# Patient Record
Sex: Female | Born: 1985 | Race: White | Hispanic: No | Smoking: Current every day smoker
Health system: Southern US, Community
[De-identification: ages and names within clinical notes are randomized; demographics above are authoritative.]

## PROBLEM LIST (undated history)

## (undated) HISTORY — PX: BREAST SURGERY: SHX581

---

## 2009-02-16 ENCOUNTER — Emergency Department (HOSPITAL_COMMUNITY): Admission: EM | Admit: 2009-02-16 | Discharge: 2009-02-16 | Payer: Self-pay | Admitting: Emergency Medicine

## 2010-11-24 LAB — URINALYSIS, ROUTINE W REFLEX MICROSCOPIC
Bilirubin Urine: NEGATIVE
Nitrite: POSITIVE — AB
Protein, ur: >300 mg/dL — AB

## 2011-03-09 ENCOUNTER — Inpatient Hospital Stay (INDEPENDENT_AMBULATORY_CARE_PROVIDER_SITE_OTHER)
Admission: RE | Admit: 2011-03-09 | Discharge: 2011-03-09 | Disposition: A | Discharge status: Home / Self Care | Payer: Self-pay | Source: Ambulatory Visit | Attending: Family Medicine | Admitting: Family Medicine

## 2011-03-09 DIAGNOSIS — T5491XA Toxic effect of unspecified corrosive substance, accidental (unintentional), initial encounter: Secondary | ICD-10-CM

## 2011-03-09 DIAGNOSIS — T300 Burn of unspecified body region, unspecified degree: Secondary | ICD-10-CM

## 2015-10-08 ENCOUNTER — Encounter: Payer: Self-pay | Admitting: Emergency Medicine

## 2015-10-08 ENCOUNTER — Emergency Department (INDEPENDENT_AMBULATORY_CARE_PROVIDER_SITE_OTHER): Payer: Commercial Managed Care - PPO

## 2015-10-08 ENCOUNTER — Emergency Department (INDEPENDENT_AMBULATORY_CARE_PROVIDER_SITE_OTHER)
Admission: EM | Admit: 2015-10-08 | Discharge: 2015-10-08 | Disposition: A | Discharge status: Home / Self Care | Payer: Commercial Managed Care - PPO | Source: Home / Self Care | Attending: Family Medicine | Admitting: Family Medicine

## 2015-10-08 DIAGNOSIS — K760 Fatty (change of) liver, not elsewhere classified: Secondary | ICD-10-CM | POA: Diagnosis not present

## 2015-10-08 DIAGNOSIS — R1031 Right lower quadrant pain: Secondary | ICD-10-CM | POA: Diagnosis not present

## 2015-10-08 DIAGNOSIS — R112 Nausea with vomiting, unspecified: Secondary | ICD-10-CM

## 2015-10-08 LAB — POCT CBC W AUTO DIFF (K'VILLE URGENT CARE)

## 2015-10-08 LAB — POCT URINE PREGNANCY: Preg Test, Ur: NEGATIVE

## 2015-10-08 MED ORDER — ONDANSETRON HCL 4 MG PO TABS: 4.0000 mg | ORAL_TABLET | Freq: Four times a day (QID) | ORAL | Status: DC

## 2015-10-08 MED ORDER — HYDROCODONE-ACETAMINOPHEN 5-325 MG PO TABS: 2.0000 | ORAL_TABLET | Freq: Four times a day (QID) | ORAL | Status: DC | PRN

## 2015-10-08 MED ORDER — IOHEXOL 300 MG/ML  SOLN
100.0000 mL | Freq: Once | INTRAMUSCULAR | Status: AC | PRN
  Administered 2015-10-08: 100 mL via INTRAVENOUS

## 2015-10-08 NOTE — ED Notes (Signed)
LRQ pain started about 2:30 this morning, vomited x 1, went to [facility] they did UA, she has small amt of blood in urine

## 2015-10-08 NOTE — ED Provider Notes (Signed)
CSN: [id number]     Arrival date & time 10/08/15  1346 History   First MD Initiated Contact with Patient 10/08/15 1440     Chief Complaint  Patient presents with  . Abdominal Pain   (Consider location/radiation/quality/duration/timing/severity/associated sxs/prior Treatment) HPI  The pt is a 30yo female advised to come to [facility] by her PCP for further evaluation of RLQ abdominal pain that started this morning around 2:30AM.  She has associated nausea and vomited once since onset.  Pain is aching, cramping and sharp, 4/10 at this time. She did have ibuprofen earlier today.  She was also seen at [facility], they performed a UA and found a small amount of blood in her urine.  Pt denies hx of kidney stones. She is not on her menstrual cycle. Denies hx of ovarian cysts or fibroids.  Her PCP recommended CT and/or U/S.  Pt denies fever, chills, n/v/d. No hx of abdominal surgeries.   History reviewed. No pertinent past medical history. Past Surgical History  Procedure Laterality Date  . Breast surgery     No family history on file. Social History  Substance Use Topics  . Smoking status: Current Every Day Smoker -- 1.00 packs/day    Types: Cigarettes  . Smokeless tobacco: None  . Alcohol Use: Yes   OB History    No data available     Review of Systems  Constitutional: Negative.  Negative for fever and chills.  Respiratory: Negative for chest tightness and shortness of breath.   Cardiovascular: Negative for chest pain.  Gastrointestinal: Positive for nausea, vomiting and abdominal pain. Negative for diarrhea, constipation, blood in stool, abdominal distention, anal bleeding and rectal pain.  Genitourinary: Positive for flank pain (Right). Negative for dysuria, urgency, hematuria, vaginal bleeding, vaginal discharge, vaginal pain, menstrual problem and pelvic pain.  Musculoskeletal: Negative for myalgias and back pain.  Skin: Negative.   Neurological: Negative for weakness.    Allergies    Review of patient's allergies indicates no known allergies.  Home Medications   Prior to Admission medications   Medication Sig Start Date End Date Taking? Authorizing Provider  citalopram (CELEXA) 40 MG tablet Take 40 mg by mouth daily.   Yes Historical Provider, MD  levonorgestrel (MIRENA) 20 MCG/24HR IUD 1 each by Intrauterine route once.   Yes Historical Provider, MD  ondansetron (ZOFRAN-ODT) 4 MG disintegrating tablet Take 4 mg by mouth every 8 (eight) hours as needed for nausea or vomiting.   Yes Historical Provider, MD  HYDROcodone-acetaminophen (NORCO/VICODIN) 5-325 MG tablet Take 2 tablets by mouth every 6 (six) hours as needed for moderate pain or severe pain. 10/08/15   Junius Finner, PA-C  ondansetron (ZOFRAN) 4 MG tablet Take 1 tablet (4 mg total) by mouth every 6 (six) hours. 10/08/15   Junius Finner, PA-C   Meds Ordered and Administered this Visit  Medications - No data to display  BP 122/79 mmHg  Pulse 72  Temp(Src) 98.6 F (37 C) (Oral)  Ht  (1.626 m)  Wt 206 lb (93.441 kg)  BMI 35.34 kg/m2  SpO2 98%  LMP 09/19/2015 No data found.   Physical Exam  Constitutional: She appears well-developed and well-nourished. No distress.  Pt sitting on exam table, appears well, non-toxic. NAD.   HENT:  Head: Normocephalic and atraumatic.  Mouth/Throat: Oropharynx is clear and moist.  Eyes: Conjunctivae are normal. No scleral icterus.  Neck: Normal range of motion. Neck supple.  Cardiovascular: Normal rate, regular rhythm and normal heart sounds.  Pulmonary/Chest: Effort normal and breath sounds normal. No respiratory distress. She has no wheezes. She has no rales. She exhibits no tenderness.  Abdominal: Soft. Bowel sounds are normal. She exhibits no distension and no mass. There is tenderness. There is no rebound, no guarding and no CVA tenderness.    Soft, obese abdomen, tenderness in RLQ. No rebound, guarding or masses.  Musculoskeletal: Normal range of motion.   Neurological: She is alert.  Skin: Skin is warm and dry. She is not diaphoretic.  Nursing note and vitals reviewed.   ED Course  Procedures (including critical care time)  Labs Review Labs Reviewed  COMPLETE METABOLIC PANEL WITH GFR  POCT CBC W AUTO DIFF ([facility])  POCT URINE PREGNANCY    Imaging Review Ct Abdomen Pelvis W Contrast  10/08/2015  CLINICAL DATA:  30 year old with acute onset of right lower quadrant abdominal pain and tenderness which began earlier today. One episode of nausea and vomiting. No prior abdominal surgeries. Indwelling intrauterine device. EXAM: CT ABDOMEN AND PELVIS WITH CONTRAST TECHNIQUE: Multidetector CT imaging of the abdomen and pelvis was performed using the standard protocol following bolus administration of intravenous contrast. CONTRAST:  OMNIPAQUE IOHEXOL 300 MG/ML IV. Oral contrast was also administered. COMPARISON:  None. FINDINGS: Lower chest:  Heart size normal.  Visualized lung bases clear. Hepatobiliary: Diffuse hepatic steatosis without focal hepatic parenchymal abnormality. Anatomic variant in that the left lobe of the liver extends well across the midline into the left upper quadrant. Gallbladder normal in appearance without calcified gallstones. No biliary ductal dilation. Pancreas: Normal in appearance without evidence of mass, ductal dilation, or inflammation. Spleen: Normal in size and appearance. Adrenals/Urinary Tract: Normal appearing adrenal glands. Kidneys normal in size and appearance without focal parenchymal abnormality. No evidence of urinary tract calculi or obstruction. Normal-appearing urinary bladder. Stomach/Bowel: Stomach normal in appearance for the degree of distention. Normal-appearing small bowel. Normal-appearing colon with expected stool burden. Normal-appearing appendix without evidence of inflammation. No ascites. Vascular/Lymphatic: No visible aortoiliofemoral atherosclerosis. Widely patent visceral  arteries. Normal-appearing portal venous and systemic venous systems. No pathologic lymphadenopathy. Reproductive: Normal-appearing uterus. Intrauterine device appropriately positioned in the fundal endometrium. Normal ovaries for age containing multiple follicular cysts. No adnexal masses. Other: None. 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 No acute abnormality. IMPRESSION: 1. No acute abnormalities involving the abdomen or pelvis. Specifically, no evidence of acute appendicitis. 2. Diffuse hepatic steatosis without focal hepatic parenchymal abnormality. 3. Chronic musculoskeletal findings as detailed above. Electronically Signed   By: Hulan Saas M.D.   On: 10/08/2015 15:55      MDM   1. Non-intractable vomiting with nausea, vomiting of unspecified type   2. RLQ abdominal pain   3. Fatty liver    Pt c/o RLQ abdominal pain.  Pt directed to come to [facility] by her PCP as she needed further workup and did not want to wait in an Emergency Department.  Zofran was given at [facility]   Pt appears well, non-toxic. Is afebrile.  Tenderness in RLQ. suspicion for appendicitis vs ovarian cyst or fibroid vs kidney stone due to hx of hematuria.  Urine preg: negative CBC: unremarkable CMP: pending   CT abd: no acute abnormalities involving abdomen or pelvis.  Specifically, no evidence of appendicitis.  Diffuse hepatic steatosis w/o focal hepatic parenchymal abnormality   Pt reassured no evidence of emergent process taking place at  this time. No vomiting while pt in UC  Rx: Zofran and norco if pain becomes more severe  F/u with PCP this week for recheck of symptoms. Discussed symptoms that warrant emergent care in the ED. Patient verbalized understanding and agreement with treatment plan.    Junius Finner, PA-C 10/08/15 1929

## 2015-10-08 NOTE — Discharge Instructions (Signed)
°  Norco/Vicodin (hydrocodone-acetaminophen) is a narcotic pain medication, do not combine these medications with others containing tylenol. While taking, do not drink alcohol, drive, or perform any other activities that requires focus while taking these medications.  ° °

## 2015-10-09 ENCOUNTER — Telehealth: Payer: Self-pay | Admitting: Emergency Medicine

## 2015-10-09 LAB — COMPLETE METABOLIC PANEL WITH GFR
ALT: 23 U/L (ref 6–29)
AST: 15 U/L (ref 10–30)
Albumin: 4.4 g/dL (ref 3.6–5.1)
Alkaline Phosphatase: 78 U/L (ref 33–115)
BUN: 7 mg/dL (ref 7–25)
CO2: 27 mmol/L (ref 20–31)
Calcium: 9.3 mg/dL (ref 8.6–10.2)
Chloride: 107 mmol/L (ref 98–110)
Creat: 0.7 mg/dL (ref 0.50–1.10)
GFR, Est African American: >89 mL/min (ref >=60)
GFR, Est Non African American: >89 mL/min (ref >=60)
Glucose, Bld: 92 mg/dL (ref 65–99)
Potassium: 4 mmol/L (ref 3.5–5.3)
Sodium: 140 mmol/L (ref 135–146)
Total Bilirubin: 0.4 mg/dL (ref 0.2–1.2)
Total Protein: 7 g/dL (ref 6.1–8.1)

## 2015-12-25 ENCOUNTER — Emergency Department (INDEPENDENT_AMBULATORY_CARE_PROVIDER_SITE_OTHER)
Admission: EM | Admit: 2015-12-25 | Discharge: 2015-12-25 | Disposition: A | Discharge status: Home / Self Care | Payer: Commercial Managed Care - PPO | Source: Home / Self Care

## 2015-12-25 ENCOUNTER — Encounter: Payer: Self-pay | Admitting: *Deleted

## 2015-12-25 DIAGNOSIS — H6093 Unspecified otitis externa, bilateral: Secondary | ICD-10-CM | POA: Diagnosis not present

## 2015-12-25 MED ORDER — HYDROCODONE-ACETAMINOPHEN 5-325 MG PO TABS: 2.0000 | ORAL_TABLET | Freq: Four times a day (QID) | ORAL | Status: DC | PRN

## 2015-12-25 MED ORDER — NEOMYCIN-POLYMYXIN-HC 3.5-10000-1 OT SUSP: 4.0000 [drp] | Freq: Three times a day (TID) | OTIC | Status: DC

## 2015-12-25 MED ORDER — IBUPROFEN 600 MG PO TABS
600.0000 mg | ORAL_TABLET | Freq: Once | ORAL | Status: AC
  Administered 2015-12-25: 600 mg via ORAL

## 2015-12-25 NOTE — ED Provider Notes (Signed)
CSN: [id number]     Arrival date & time 12/25/15  0806 History   None    Chief Complaint  Patient presents with  . Otalgia   (Consider location/radiation/quality/duration/timing/severity/associated sxs/prior Treatment) Patient is a 30 y.o. female presenting with ear pain. The history is provided by the patient. No language interpreter was used.  Otalgia Location:  Bilateral Behind ear:  Redness and swelling Quality:  Aching Severity:  Moderate Onset quality:  Gradual Duration:  3 weeks Timing:  Constant Progression:  Worsening Chronicity:  New Context: water   Relieved by:  Nothing Worsened by:  Nothing tried Ineffective treatments:  None tried Associated symptoms: no rhinorrhea and no sore throat   Risk factors: no chronic ear infection   Pt complains of pain in both ears  History reviewed. No pertinent past medical history. Past Surgical History  Procedure Laterality Date  . Breast surgery     History reviewed. No pertinent family history. Social History  Substance Use Topics  . Smoking status: Current Every Day Smoker -- 1.00 packs/day    Types: Cigarettes  . Smokeless tobacco: None  . Alcohol Use: Yes   OB History    No data available     Review of Systems  HENT: Positive for ear pain. Negative for rhinorrhea and sore throat.   All other systems reviewed and are negative.   Allergies  Review of patient's allergies indicates no known allergies.  Home Medications   Prior to Admission medications   Medication Sig Start Date End Date Taking? Authorizing Provider  citalopram (CELEXA) 40 MG tablet Take 40 mg by mouth daily.    Historical Provider, MD  HYDROcodone-acetaminophen (NORCO/VICODIN) 5-325 MG tablet Take 2 tablets by mouth every 6 (six) hours as needed for moderate pain or severe pain. 10/08/15   Junius FinnerErin O'Malley, PA-C  levonorgestrel (MIRENA) 20 MCG/24HR IUD 1 each by Intrauterine route once.    Historical Provider, MD  neomycin-polymyxin-hydrocortisone  (CORTISPORIN) 3.5-10000-1 otic suspension Place 4 drops into both ears 3 (three) times daily. 12/25/15   Elson AreasLeslie K Sofia, PA-C  ondansetron (ZOFRAN) 4 MG tablet Take 1 tablet (4 mg total) by mouth every 6 (six) hours. 10/08/15   Junius FinnerErin O'Malley, PA-C   Meds Ordered and Administered this Visit  Medications - No data to display  BP 144/101 mmHg  Pulse 85  Temp(Src) 99.4 F (37.4 C) (Oral)  Resp 16  Wt 206 lb (93.441 kg)  SpO2 96% No data found.   Physical Exam  Constitutional: She is oriented to person, place, and time. She appears well-developed and well-nourished.  HENT:  Head: Normocephalic.  bilat canals swollen, I am unable to visualize TM's,    Eyes: Conjunctivae and EOM are normal. Pupils are equal, round, and reactive to light.  Neck: Normal range of motion.  Cardiovascular: Normal rate.   Pulmonary/Chest: Effort normal.  Abdominal: She exhibits no distension.  Musculoskeletal: Normal range of motion.  Neurological: She is alert and oriented to person, place, and time.  Psychiatric: She has a normal mood and affect.  Nursing note and vitals reviewed.   ED Course  Procedures (including critical care time)  Labs Review Labs Reviewed - No data to display  Imaging Review No results found.   Visual Acuity Review  Right Eye Distance:   Left Eye Distance:   Bilateral Distance:    Right Eye Near:   Left Eye Near:    Bilateral Near:       Meds ordered this encounter  Medications  .  neomycin-polymyxin-hydrocortisone (CORTISPORIN) 3.5-10000-1 otic suspension    Sig: Place 4 drops into both ears 3 (three) times daily.    Dispense:  10 mL    Refill:  0    Order Specific Question:  Supervising Provider    Answer:  MASSEY, DAVID [[id number]]    MDM   1. Otitis external, bilateral    Pt advised to see her MD for recheck in 2-3 days Pt declined pain medications Pt advised to continue augmentin and add cortisporin     Elson Areas, PA-C 12/25/15 [phone]

## 2015-12-25 NOTE — ED Notes (Signed)
Pt c/o bilateral ear pain x 2-3 days. She began Augmentin 875mg  BID yesterday from an E-visit. No OTC meds.

## 2015-12-25 NOTE — Discharge Instructions (Signed)

## 2017-07-07 ENCOUNTER — Other Ambulatory Visit: Payer: Self-pay

## 2017-07-07 ENCOUNTER — Emergency Department (INDEPENDENT_AMBULATORY_CARE_PROVIDER_SITE_OTHER): Payer: Commercial Managed Care - PPO

## 2017-07-07 ENCOUNTER — Encounter: Payer: Self-pay | Admitting: *Deleted

## 2017-07-07 ENCOUNTER — Emergency Department (INDEPENDENT_AMBULATORY_CARE_PROVIDER_SITE_OTHER)
Admission: EM | Admit: 2017-07-07 | Discharge: 2017-07-07 | Disposition: A | Discharge status: Home / Self Care | Payer: Commercial Managed Care - PPO | Source: Home / Self Care | Attending: Family Medicine | Admitting: Family Medicine

## 2017-07-07 DIAGNOSIS — Z87891 Personal history of nicotine dependence: Secondary | ICD-10-CM | POA: Diagnosis not present

## 2017-07-07 DIAGNOSIS — R1011 Right upper quadrant pain: Secondary | ICD-10-CM

## 2017-07-07 DIAGNOSIS — R05 Cough: Secondary | ICD-10-CM | POA: Diagnosis not present

## 2017-07-07 LAB — POCT CBC W AUTO DIFF (K'VILLE URGENT CARE)

## 2017-07-07 LAB — COMPLETE METABOLIC PANEL WITH GFR
AG RATIO: 1.8 (calc) (ref 1.0–2.5)
ALT: 39 U/L — ABNORMAL HIGH (ref 6–29)
AST: 22 U/L (ref 10–30)
Albumin: 4.7 g/dL (ref 3.6–5.1)
Alkaline phosphatase (APISO): 70 U/L (ref 33–115)
BUN: 10 mg/dL (ref 7–25)
CALCIUM: 9.4 mg/dL (ref 8.6–10.2)
CO2: 27 mmol/L (ref 20–32)
Chloride: 103 mmol/L (ref 98–110)
Creat: 0.88 mg/dL (ref 0.50–1.10)
GFR, EST NON AFRICAN AMERICAN: 88 mL/min/{1.73_m2} (ref >=60)
GFR, Est African American: 102 mL/min/{1.73_m2} (ref >=60)
GLOBULIN: 2.6 g/dL (ref 1.9–3.7)
Glucose, Bld: 115 mg/dL — ABNORMAL HIGH (ref 65–99)
POTASSIUM: 4.4 mmol/L (ref 3.5–5.3)
SODIUM: 138 mmol/L (ref 135–146)
Total Bilirubin: 0.5 mg/dL (ref 0.2–1.2)
Total Protein: 7.3 g/dL (ref 6.1–8.1)

## 2017-07-07 LAB — POCT URINALYSIS DIP (MANUAL ENTRY)
BILIRUBIN UA: NEGATIVE
BILIRUBIN UA: NEGATIVE mg/dL
Glucose, UA: NEGATIVE mg/dL
LEUKOCYTES UA: NEGATIVE
Nitrite, UA: NEGATIVE
PH UA: 5.5 (ref 5.0–8.0)
Protein Ur, POC: NEGATIVE mg/dL
Spec Grav, UA: 1.02 (ref 1.010–1.025)
Urobilinogen, UA: 0.2 E.U./dL

## 2017-07-07 MED ORDER — KETOROLAC TROMETHAMINE 60 MG/2ML IM SOLN
60.0000 mg | Freq: Once | INTRAMUSCULAR | Status: AC
  Administered 2017-07-07: 60 mg via INTRAMUSCULAR

## 2017-07-07 NOTE — ED Triage Notes (Signed)
Pt c/o RUQ abd pain x 0830 today. She reports some nausea. Denies vomiting or diarrhea.

## 2017-07-07 NOTE — ED Notes (Signed)
Blood drawn from LT antecubital area. Attempted 1 stick. Pt tolerated procedure well. Margia Wiesen, LPN   

## 2017-07-07 NOTE — ED Provider Notes (Signed)
[facility]    CSN: [id number] Arrival date & time: 07/07/17  1028     History   Chief Complaint Chief Complaint  Patient presents with  . Abdominal Pain    HPI Ana Wright is a 31 y.o. female.   At about 0830 today patient developed right side pain followed by chills and nausea (no vomiting).  The pain is worse with movement and deep inspiration.  She denies cough or shortness of breath.  No urinary symptoms.  She reports that she cannot find a comfortable position.  She has not eaten this morning.   The history is provided by the patient.    History reviewed. No pertinent past medical history.  There are no active problems to display for this patient.   Past Surgical History:  Procedure Laterality Date  . BREAST SURGERY      OB History    No data available       Home Medications    Prior to Admission medications   Medication Sig Start Date End Date Taking? Authorizing Provider  citalopram (CELEXA) 40 MG tablet Take 40 mg by mouth daily.   Yes [provider]  levonorgestrel (MIRENA) 20 MCG/24HR IUD 1 each by Intrauterine route once.   Yes [provider]    Family History History reviewed. No pertinent family history.  Social History Social History   Tobacco Use  . Smoking status: Current Every Day Smoker    Packs/day: 0.50    Types: Cigarettes  . Smokeless tobacco: Never Used  Substance Use Topics  . Alcohol use: Yes  . Drug use: No     Allergies   Patient has no known allergies.   Review of Systems Review of Systems  Constitutional: Positive for activity change, appetite change, chills and fatigue. Negative for diaphoresis, fever and unexpected weight change.  HENT: Negative.   Eyes: Negative.   Respiratory: Negative for cough, chest tightness, shortness of breath and wheezing.   Cardiovascular: Negative for chest pain.  Gastrointestinal: Positive for abdominal distention, abdominal pain and nausea.  Negative for blood in stool, constipation and vomiting.  Genitourinary: Negative.   Musculoskeletal: Negative.   Skin: Positive for rash.     Physical Exam Triage Vital Signs ED Triage Vitals  Enc Vitals Group     BP 07/07/17 1049 (!) 141/81     Pulse Rate 07/07/17 1049 (!) 101     Resp 07/07/17 1049 18     Temp 07/07/17 1049 99.8 F (37.7 C)     Temp Source 07/07/17 1049 Oral     SpO2 07/07/17 1049 97 %     Weight 07/07/17 1050 213 lb (96.6 kg)     Height 07/07/17 1050 5\' 4"  (1.626 m)     Head Circumference --      Peak Flow --      Pain Score 07/07/17 1050 6     Pain Loc --      Pain Edu? --      Excl. in GC? --    No data found.  Updated Vital Signs BP (!) 141/81 (BP Location: Left Arm)   Pulse (!) 101   Temp 99.8 F (37.7 C) (Oral)   Resp 18   Ht 5\' 4"  (1.626 m)   Wt 213 lb (96.6 kg)   SpO2 97%   BMI 36.56 kg/m   Visual Acuity Right Eye Distance:   Left Eye Distance:   Bilateral Distance:    Right Eye Near:  Left Eye Near:    Bilateral Near:     Physical Exam  Constitutional: She appears well-developed and well-nourished. She does not appear ill. No distress.  HENT:  Head: Normocephalic.  Mouth/Throat: Oropharynx is clear and moist.  Eyes: EOM are normal. Pupils are equal, round, and reactive to light.  Cardiovascular: Normal rate and normal heart sounds.  Pulmonary/Chest: Breath sounds normal.  Abdominal: Soft. She exhibits no distension and no mass. Bowel sounds are decreased. There is no splenomegaly. There is tenderness in the right upper quadrant. There is guarding and positive Murphy's sign. There is no rigidity and no tenderness at McBurney's point.    Distinct right upper quadrant tenderness to palpation extending to right flank.  Nursing note and vitals reviewed.    UC Treatments / Results  Labs (all labs ordered are listed, but only abnormal results are displayed) Labs Reviewed  POCT CBC W AUTO DIFF ([facility]) - Abnormal:    WBC 19.0; LY 10.0; MO 5.8; GR 84.2; Hgb 17.4; Platelets 275   POCT URINALYSIS DIP (MANUAL ENTRY) - Abnormal; Notable for the following components:      Result Value   Blood, UA trace-lysed (*)    All other components within normal limits  COMPLETE METABOLIC PANEL WITH GFR    EKG  EKG Interpretation None       Radiology Dg Chest 2 View  Result Date: 07/07/2017 CLINICAL DATA:  C/o Pain RUQ And RT inferior chest with recent cough. Hx smoker. EXAM: CHEST - 2 VIEW COMPARISON:  CT 10/08/2015 FINDINGS: Lungs are clear. Heart size and mediastinal contours are within normal limits. No effusion.  No pneumothorax. Visualized bones unremarkable. IMPRESSION: No acute cardiopulmonary disease. Electronically Signed   By: Corlis Leak  Hassell M.D.   On: 07/07/2017 13:46   [place] Abdomen Complete  Result Date: 07/07/2017 CLINICAL DATA:  Acute right upper quadrant abdominal pain. EXAM: ABDOMEN ULTRASOUND COMPLETE COMPARISON:  CT scan of October 08, 2015. FINDINGS: Gallbladder: No gallstones or wall thickening visualized. No sonographic Murphy sign noted by sonographer. Common bile duct: Diameter: 3 mm which is within normal limits. Liver: No focal lesion identified. Increased echogenicity of hepatic parenchyma is noted. Portal vein is patent on color Doppler imaging with normal direction of blood flow towards the liver. IVC: No abnormality visualized. Pancreas: Not well visualized due to overlying bowel gas. Spleen: Size and appearance within normal limits. Right Kidney: Length: 11 cm. Echogenicity within normal limits. No mass or hydronephrosis visualized. Left Kidney: Length: 12.7 cm. Echogenicity within normal limits. No mass or hydronephrosis visualized. Abdominal aorta: No aneurysm visualized. Other findings: None. IMPRESSION: Increased echogenicity of hepatic parenchyma is noted consistent with fatty infiltration or other diffuse hepatocellular disease. Pancreas is not visualized due to overlying bowel gas. No  other abnormality seen in the abdomen. Electronically Signed   By: Lupita RaiderJames  Green Jr, M.D.   On: 07/07/2017 12:44    Procedures Procedures (including critical care time)  Medications Ordered in UC Medications  ketorolac (TORADOL) injection 60 mg (60 mg Intramuscular Given 07/07/17 1214)     Initial Impression / Assessment and Plan / UC Course  I have reviewed the triage vital signs and the nursing notes.  Pertinent labs & imaging results that were available during my care of the patient were reviewed by me and considered in my medical decision making (see chart for details).    Administered Toradol 60mg  IM  Suspect acute pancreatitis.  Note leukocytosis (WBC 19.0) and elevated Hgb17.4 Patient declines medical  transport. She is stable for travel by private vehicle.    Final Clinical Impressions(s) / UC Diagnoses   Final diagnoses:  Abdominal pain, right upper quadrant    ED Discharge Orders    None           Lattie Haw, MD 07/09/17 [phone]

## 2017-07-08 ENCOUNTER — Telehealth: Payer: Self-pay | Admitting: *Deleted

## 2017-07-08 NOTE — Telephone Encounter (Signed)
LM with lab results and to call back if she has any questions or concerns.  

## 2017-08-13 ENCOUNTER — Telehealth: Payer: Self-pay

## 2019-02-11 IMAGING — US US ABDOMEN COMPLETE
1 series · 14 of 25 positions shown · non-contrast
Comparison: CT scan of October 08, 2015.

CLINICAL DATA: Acute right upper quadrant abdominal pain.

EXAM:
ABDOMEN ULTRASOUND COMPLETE

[Series 1: us abdomen complete · 0.18mm/px · 14 of 70 slices shown]
[im 1/70]
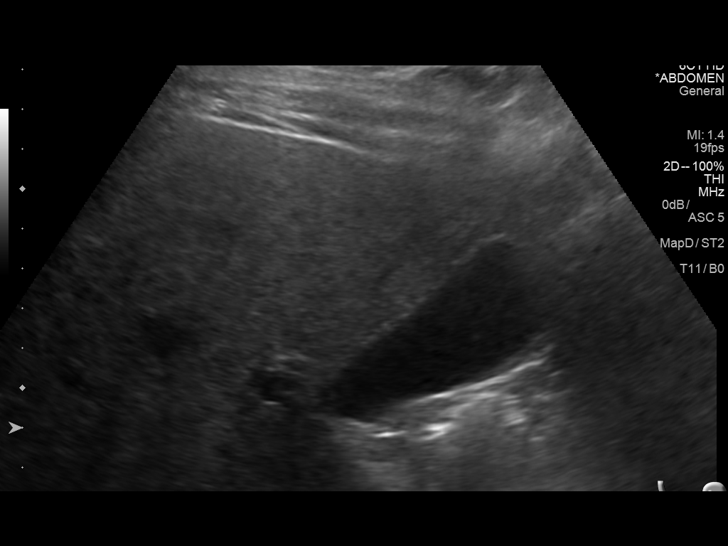
[im 6/70]
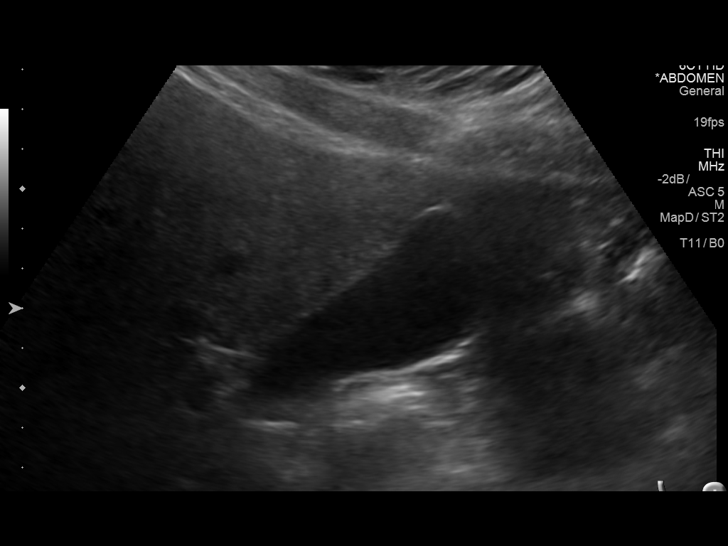
[im 12/70]
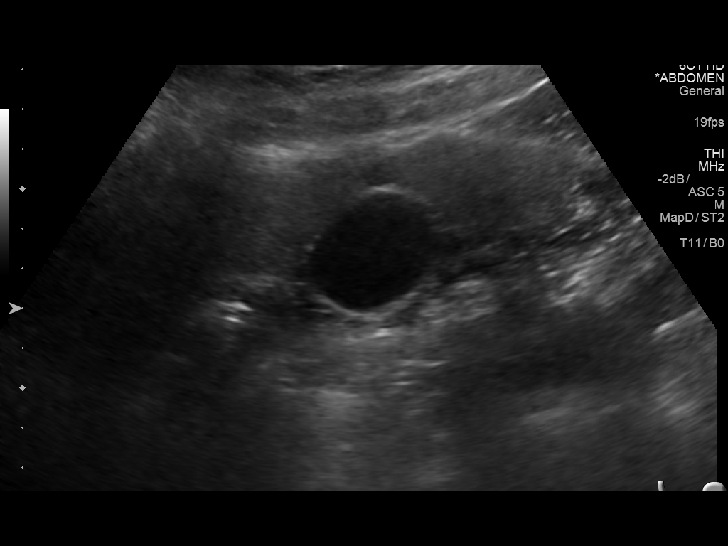
[im 18/70]
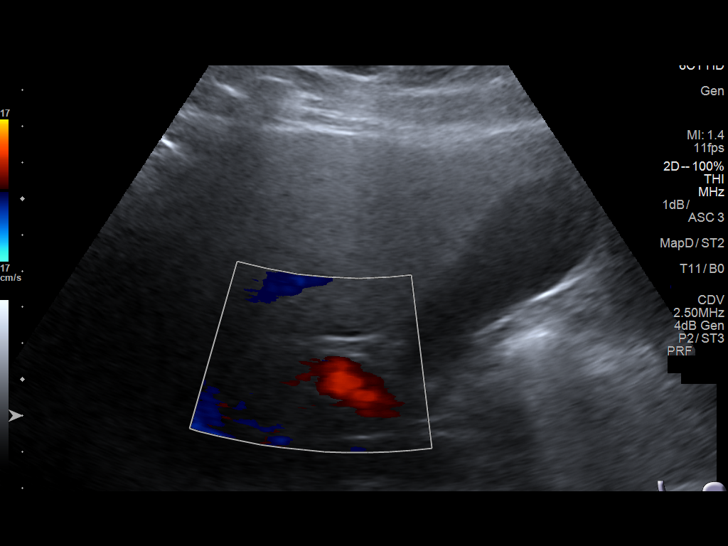
[im 24/70]
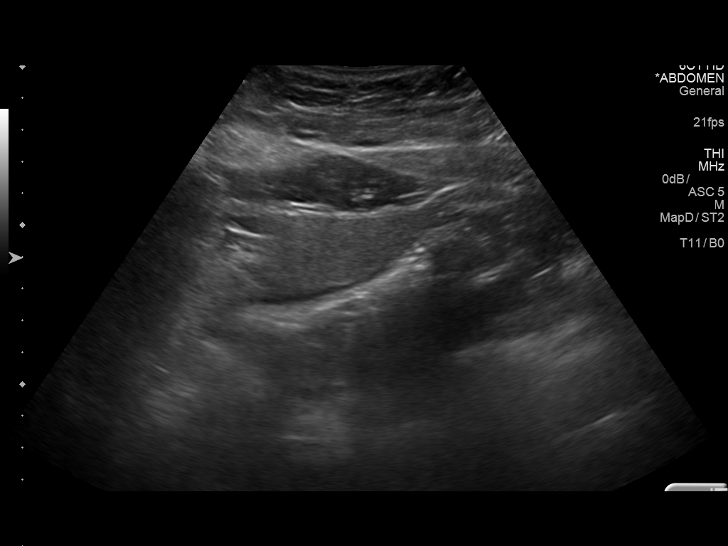
[im 26/70]
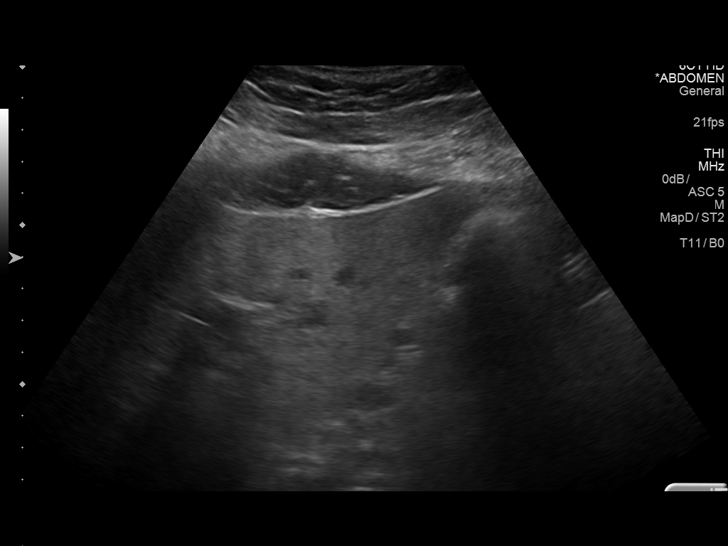
[im 32/70]
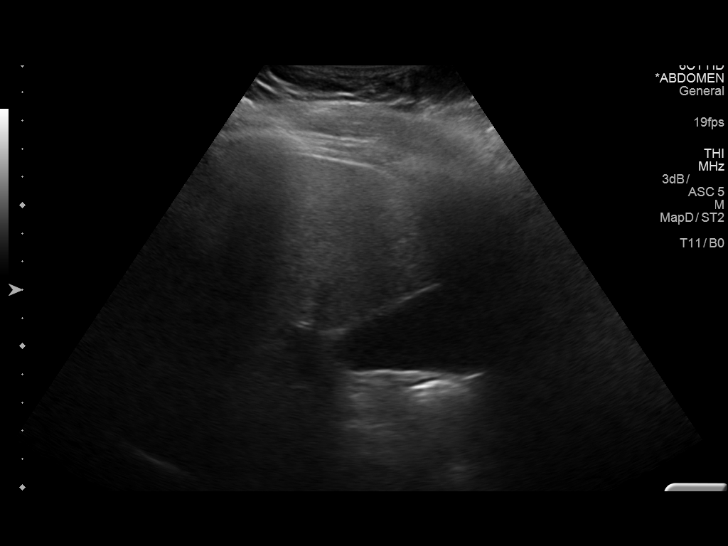
[im 38/70]
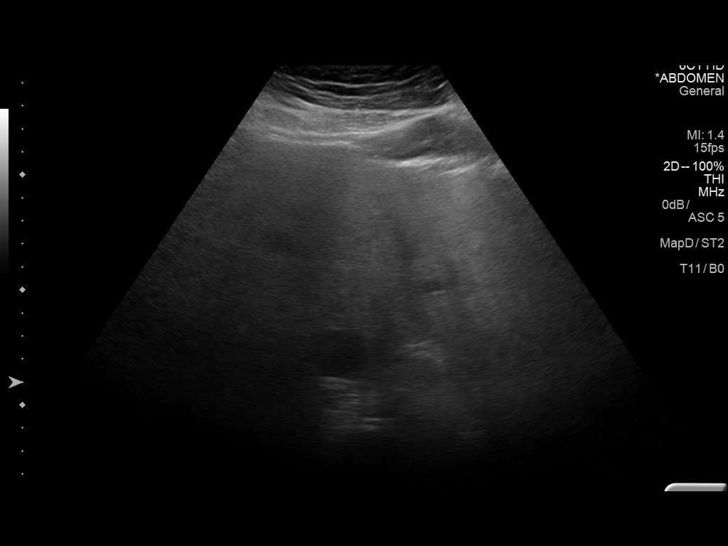
[im 44/70]
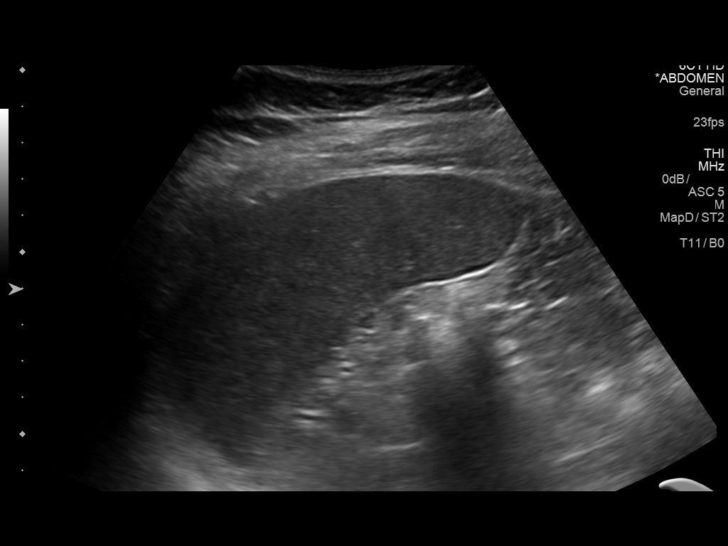
[im 47/70]
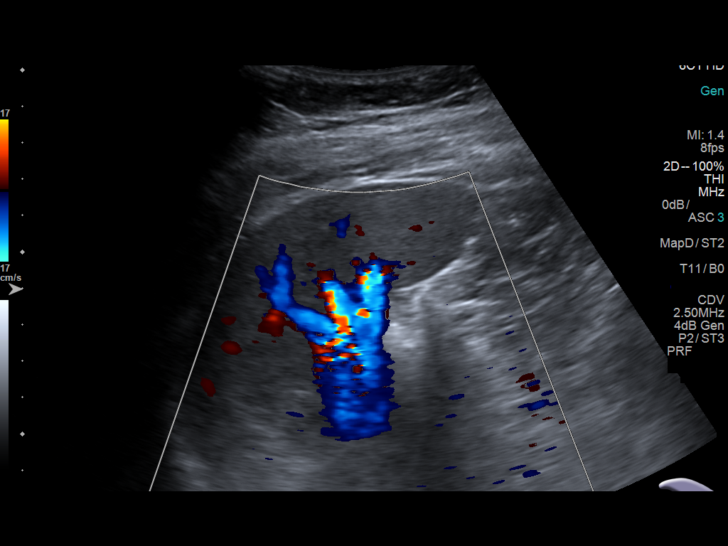
[im 52/70]
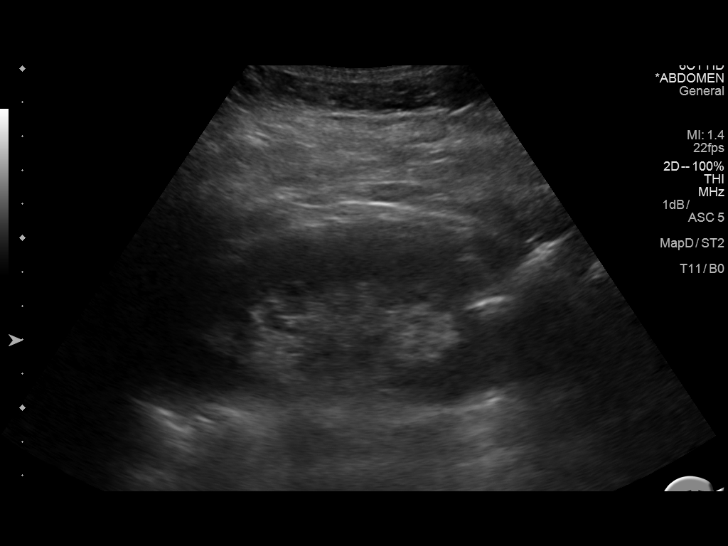
[im 58/70]
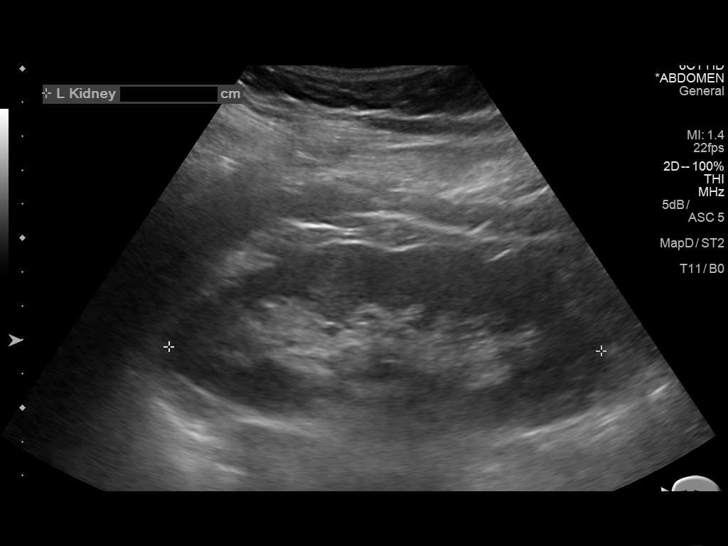
[im 64/70]
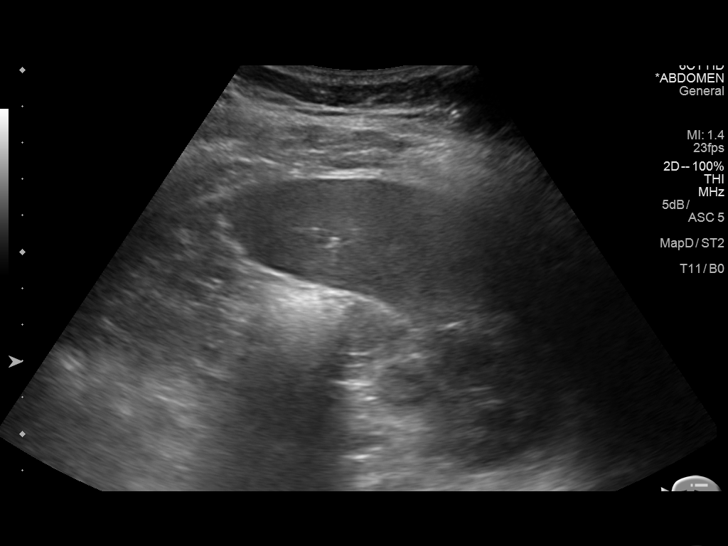
[im 70/70]
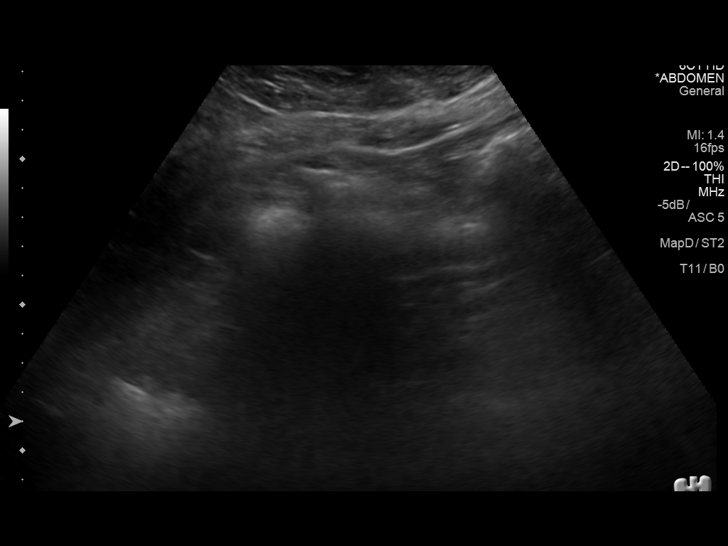

[14 of 25 positions shown; findings below may reference images not displayed]

FINDINGS: Gallbladder: No gallstones or wall thickening visualized. No
sonographic Murphy sign noted by sonographer.

Common bile duct: Diameter: 3 mm which is within normal limits.

Liver: No focal lesion identified. Increased echogenicity of hepatic
parenchyma is noted. Portal vein is patent on color Doppler imaging
with normal direction of blood flow towards the liver.

IVC: No abnormality visualized.

Pancreas: Not well visualized due to overlying bowel gas.

Spleen: Size and appearance within normal limits.

Right Kidney: Length: 11 cm. Echogenicity within normal limits. No
mass or hydronephrosis visualized.

Left Kidney: Length: 12.7 cm. Echogenicity within normal limits. No
mass or hydronephrosis visualized.

Abdominal aorta: No aneurysm visualized.

Other findings: None.
IMPRESSION: Increased echogenicity of hepatic parenchyma is noted consistent
with fatty infiltration or other diffuse hepatocellular disease.
Pancreas is not visualized due to overlying bowel gas. No other
abnormality seen in the abdomen.

## 2024-01-18 DIAGNOSIS — F419 Anxiety disorder, unspecified: Secondary | ICD-10-CM | POA: Diagnosis not present

## 2024-01-18 DIAGNOSIS — L409 Psoriasis, unspecified: Secondary | ICD-10-CM | POA: Diagnosis not present

## 2024-01-18 DIAGNOSIS — F909 Attention-deficit hyperactivity disorder, unspecified type: Secondary | ICD-10-CM | POA: Diagnosis not present

## 2024-01-18 DIAGNOSIS — F32A Depression, unspecified: Secondary | ICD-10-CM | POA: Diagnosis not present

## 2024-02-03 DIAGNOSIS — S61211A Laceration without foreign body of left index finger without damage to nail, initial encounter: Secondary | ICD-10-CM | POA: Diagnosis not present

## 2024-02-13 DIAGNOSIS — S61211D Laceration without foreign body of left index finger without damage to nail, subsequent encounter: Secondary | ICD-10-CM | POA: Diagnosis not present

## 2024-02-13 DIAGNOSIS — L03012 Cellulitis of left finger: Secondary | ICD-10-CM | POA: Diagnosis not present

## 2024-03-14 DIAGNOSIS — Z113 Encounter for screening for infections with a predominantly sexual mode of transmission: Secondary | ICD-10-CM | POA: Diagnosis not present

## 2024-03-14 DIAGNOSIS — Z309 Encounter for contraceptive management, unspecified: Secondary | ICD-10-CM | POA: Diagnosis not present

## 2024-03-14 DIAGNOSIS — Z6834 Body mass index (BMI) 34.0-34.9, adult: Secondary | ICD-10-CM | POA: Diagnosis not present

## 2024-03-14 DIAGNOSIS — Z01419 Encounter for gynecological examination (general) (routine) without abnormal findings: Secondary | ICD-10-CM | POA: Diagnosis not present

## 2024-03-17 DIAGNOSIS — F411 Generalized anxiety disorder: Secondary | ICD-10-CM | POA: Diagnosis not present

## 2024-03-17 DIAGNOSIS — F909 Attention-deficit hyperactivity disorder, unspecified type: Secondary | ICD-10-CM | POA: Diagnosis not present

## 2024-03-17 DIAGNOSIS — R635 Abnormal weight gain: Secondary | ICD-10-CM | POA: Diagnosis not present

## 2024-03-17 DIAGNOSIS — Z6834 Body mass index (BMI) 34.0-34.9, adult: Secondary | ICD-10-CM | POA: Diagnosis not present

## 2024-03-22 ENCOUNTER — Ambulatory Visit: Admitting: Family Medicine

## 2024-03-28 DIAGNOSIS — F4320 Adjustment disorder, unspecified: Secondary | ICD-10-CM | POA: Diagnosis not present

## 2024-04-11 DIAGNOSIS — F4320 Adjustment disorder, unspecified: Secondary | ICD-10-CM | POA: Diagnosis not present

## 2024-04-19 DIAGNOSIS — E66811 Obesity, class 1: Secondary | ICD-10-CM | POA: Diagnosis not present

## 2024-04-19 DIAGNOSIS — F900 Attention-deficit hyperactivity disorder, predominantly inattentive type: Secondary | ICD-10-CM | POA: Diagnosis not present

## 2024-04-19 DIAGNOSIS — E6609 Other obesity due to excess calories: Secondary | ICD-10-CM | POA: Diagnosis not present

## 2024-04-19 DIAGNOSIS — F331 Major depressive disorder, recurrent, moderate: Secondary | ICD-10-CM | POA: Diagnosis not present

## 2024-04-20 DIAGNOSIS — L400 Psoriasis vulgaris: Secondary | ICD-10-CM | POA: Diagnosis not present

## 2024-04-20 DIAGNOSIS — B179 Acute viral hepatitis, unspecified: Secondary | ICD-10-CM | POA: Diagnosis not present

## 2024-04-20 DIAGNOSIS — L299 Pruritus, unspecified: Secondary | ICD-10-CM | POA: Diagnosis not present

## 2024-04-20 DIAGNOSIS — Z111 Encounter for screening for respiratory tuberculosis: Secondary | ICD-10-CM | POA: Diagnosis not present

## 2024-04-20 DIAGNOSIS — Z13228 Encounter for screening for other metabolic disorders: Secondary | ICD-10-CM | POA: Diagnosis not present

## 2024-04-20 DIAGNOSIS — R6889 Other general symptoms and signs: Secondary | ICD-10-CM | POA: Diagnosis not present

## 2024-05-11 ENCOUNTER — Other Ambulatory Visit: Payer: Self-pay | Admitting: Medical Genetics

## 2024-05-12 ENCOUNTER — Other Ambulatory Visit: Payer: Self-pay

## 2024-05-13 ENCOUNTER — Other Ambulatory Visit: Payer: Self-pay

## 2024-05-23 ENCOUNTER — Other Ambulatory Visit (HOSPITAL_COMMUNITY)
Admission: RE | Admit: 2024-05-23 | Discharge: 2024-05-23 | Disposition: A | Discharge status: Home / Self Care | Payer: Self-pay | Source: Ambulatory Visit | Attending: Medical Genetics | Admitting: Medical Genetics

## 2024-05-30 LAB — GENECONNECT MOLECULAR SCREEN: Genetic Analysis Overall Interpretation: NEGATIVE

## 2024-06-15 DIAGNOSIS — Z01818 Encounter for other preprocedural examination: Secondary | ICD-10-CM | POA: Diagnosis not present

## 2024-06-15 DIAGNOSIS — Z30017 Encounter for initial prescription of implantable subdermal contraceptive: Secondary | ICD-10-CM | POA: Diagnosis not present

## 2024-06-16 DIAGNOSIS — F32A Depression, unspecified: Secondary | ICD-10-CM | POA: Diagnosis not present

## 2024-06-16 DIAGNOSIS — Z23 Encounter for immunization: Secondary | ICD-10-CM | POA: Diagnosis not present

## 2024-06-16 DIAGNOSIS — F411 Generalized anxiety disorder: Secondary | ICD-10-CM | POA: Diagnosis not present

## 2024-06-16 DIAGNOSIS — F909 Attention-deficit hyperactivity disorder, unspecified type: Secondary | ICD-10-CM | POA: Diagnosis not present

## 2024-06-28 DIAGNOSIS — Z975 Presence of (intrauterine) contraceptive device: Secondary | ICD-10-CM | POA: Diagnosis not present

## 2024-06-28 DIAGNOSIS — N921 Excessive and frequent menstruation with irregular cycle: Secondary | ICD-10-CM | POA: Diagnosis not present

## 2024-06-29 DIAGNOSIS — R002 Palpitations: Secondary | ICD-10-CM | POA: Diagnosis not present

## 2024-06-29 DIAGNOSIS — R072 Precordial pain: Secondary | ICD-10-CM | POA: Diagnosis not present

## 2024-06-29 DIAGNOSIS — R079 Chest pain, unspecified: Secondary | ICD-10-CM | POA: Diagnosis not present

## 2024-06-29 DIAGNOSIS — N938 Other specified abnormal uterine and vaginal bleeding: Secondary | ICD-10-CM | POA: Diagnosis not present

## 2024-06-29 DIAGNOSIS — N939 Abnormal uterine and vaginal bleeding, unspecified: Secondary | ICD-10-CM | POA: Diagnosis not present

## 2024-06-29 DIAGNOSIS — R0602 Shortness of breath: Secondary | ICD-10-CM | POA: Diagnosis not present

## 2024-06-29 DIAGNOSIS — Z79899 Other long term (current) drug therapy: Secondary | ICD-10-CM | POA: Diagnosis not present

## 2024-06-29 DIAGNOSIS — Z975 Presence of (intrauterine) contraceptive device: Secondary | ICD-10-CM | POA: Diagnosis not present

## 2024-06-29 DIAGNOSIS — R7989 Other specified abnormal findings of blood chemistry: Secondary | ICD-10-CM | POA: Diagnosis not present

## 2024-06-29 DIAGNOSIS — D62 Acute posthemorrhagic anemia: Secondary | ICD-10-CM | POA: Diagnosis not present

## 2024-06-29 DIAGNOSIS — R42 Dizziness and giddiness: Secondary | ICD-10-CM | POA: Diagnosis not present

## 2024-06-29 DIAGNOSIS — Z87891 Personal history of nicotine dependence: Secondary | ICD-10-CM | POA: Diagnosis not present

## 2024-07-01 DIAGNOSIS — D509 Iron deficiency anemia, unspecified: Secondary | ICD-10-CM | POA: Diagnosis not present

## 2024-07-01 DIAGNOSIS — Z975 Presence of (intrauterine) contraceptive device: Secondary | ICD-10-CM | POA: Diagnosis not present

## 2024-07-01 DIAGNOSIS — N921 Excessive and frequent menstruation with irregular cycle: Secondary | ICD-10-CM | POA: Diagnosis not present

## 2024-07-01 DIAGNOSIS — N920 Excessive and frequent menstruation with regular cycle: Secondary | ICD-10-CM | POA: Diagnosis not present

## 2024-07-13 DIAGNOSIS — D509 Iron deficiency anemia, unspecified: Secondary | ICD-10-CM | POA: Diagnosis not present

## 2024-07-14 DIAGNOSIS — R42 Dizziness and giddiness: Secondary | ICD-10-CM | POA: Diagnosis not present

## 2024-07-14 DIAGNOSIS — R5383 Other fatigue: Secondary | ICD-10-CM | POA: Diagnosis not present

## 2024-07-14 DIAGNOSIS — N939 Abnormal uterine and vaginal bleeding, unspecified: Secondary | ICD-10-CM | POA: Diagnosis not present

## 2024-07-14 DIAGNOSIS — N921 Excessive and frequent menstruation with irregular cycle: Secondary | ICD-10-CM | POA: Diagnosis not present

## 2024-07-14 DIAGNOSIS — Z87891 Personal history of nicotine dependence: Secondary | ICD-10-CM | POA: Diagnosis not present

## 2024-07-15 DIAGNOSIS — D500 Iron deficiency anemia secondary to blood loss (chronic): Secondary | ICD-10-CM | POA: Diagnosis not present

## 2024-07-15 DIAGNOSIS — N920 Excessive and frequent menstruation with regular cycle: Secondary | ICD-10-CM | POA: Diagnosis not present

## 2024-07-15 DIAGNOSIS — D509 Iron deficiency anemia, unspecified: Secondary | ICD-10-CM | POA: Diagnosis not present

## 2024-07-18 DIAGNOSIS — Z87891 Personal history of nicotine dependence: Secondary | ICD-10-CM | POA: Diagnosis not present

## 2024-07-18 DIAGNOSIS — N938 Other specified abnormal uterine and vaginal bleeding: Secondary | ICD-10-CM | POA: Diagnosis not present

## 2024-07-18 DIAGNOSIS — Z881 Allergy status to other antibiotic agents status: Secondary | ICD-10-CM | POA: Diagnosis not present

## 2024-07-18 DIAGNOSIS — Z79899 Other long term (current) drug therapy: Secondary | ICD-10-CM | POA: Diagnosis not present

## 2024-07-18 DIAGNOSIS — R1024 Suprapubic pain: Secondary | ICD-10-CM | POA: Diagnosis not present

## 2024-07-18 DIAGNOSIS — N939 Abnormal uterine and vaginal bleeding, unspecified: Secondary | ICD-10-CM | POA: Diagnosis not present

## 2024-07-22 DIAGNOSIS — D500 Iron deficiency anemia secondary to blood loss (chronic): Secondary | ICD-10-CM | POA: Diagnosis not present

## 2024-07-22 DIAGNOSIS — Z6831 Body mass index (BMI) 31.0-31.9, adult: Secondary | ICD-10-CM | POA: Diagnosis not present

## 2024-07-22 DIAGNOSIS — Z3046 Encounter for surveillance of implantable subdermal contraceptive: Secondary | ICD-10-CM | POA: Diagnosis not present

## 2024-07-22 DIAGNOSIS — F419 Anxiety disorder, unspecified: Secondary | ICD-10-CM | POA: Diagnosis not present

## 2024-07-22 DIAGNOSIS — Z30430 Encounter for insertion of intrauterine contraceptive device: Secondary | ICD-10-CM | POA: Diagnosis not present

## 2024-07-22 DIAGNOSIS — N857 Hematometra: Secondary | ICD-10-CM | POA: Diagnosis not present

## 2024-07-22 DIAGNOSIS — Z7985 Long-term (current) use of injectable non-insulin antidiabetic drugs: Secondary | ICD-10-CM | POA: Diagnosis not present

## 2024-07-22 DIAGNOSIS — E669 Obesity, unspecified: Secondary | ICD-10-CM | POA: Diagnosis not present

## 2024-07-22 DIAGNOSIS — Z87891 Personal history of nicotine dependence: Secondary | ICD-10-CM | POA: Diagnosis not present

## 2024-07-22 DIAGNOSIS — Z881 Allergy status to other antibiotic agents status: Secondary | ICD-10-CM | POA: Diagnosis not present

## 2024-07-22 DIAGNOSIS — N920 Excessive and frequent menstruation with regular cycle: Secondary | ICD-10-CM | POA: Diagnosis not present

## 2024-08-16 DIAGNOSIS — J029 Acute pharyngitis, unspecified: Secondary | ICD-10-CM | POA: Diagnosis not present

## 2024-08-16 DIAGNOSIS — R5383 Other fatigue: Secondary | ICD-10-CM | POA: Diagnosis not present

## 2024-08-16 DIAGNOSIS — R058 Other specified cough: Secondary | ICD-10-CM | POA: Diagnosis not present

## 2024-08-16 DIAGNOSIS — J069 Acute upper respiratory infection, unspecified: Secondary | ICD-10-CM | POA: Diagnosis not present
